# Patient Record
Sex: Male | Born: 1940 | Race: Black or African American | Hispanic: No
Health system: Southern US, Community
[De-identification: ages and names within clinical notes are randomized; demographics above are authoritative.]

---

## 2002-08-15 ENCOUNTER — Encounter: Payer: Self-pay | Admitting: Internal Medicine

## 2002-08-15 ENCOUNTER — Ambulatory Visit (HOSPITAL_COMMUNITY): Admission: RE | Admit: 2002-08-15 | Discharge: 2002-08-15 | Payer: Self-pay | Admitting: Internal Medicine

## 2005-01-31 ENCOUNTER — Inpatient Hospital Stay: Payer: Self-pay | Admitting: Internal Medicine

## 2005-01-31 ENCOUNTER — Other Ambulatory Visit: Payer: Self-pay

## 2005-09-29 ENCOUNTER — Emergency Department: Payer: Self-pay | Admitting: Emergency Medicine

## 2006-07-09 ENCOUNTER — Other Ambulatory Visit: Payer: Self-pay

## 2006-07-09 ENCOUNTER — Inpatient Hospital Stay: Payer: Self-pay | Admitting: Internal Medicine

## 2007-06-17 ENCOUNTER — Encounter: Payer: Self-pay | Admitting: Internal Medicine

## 2007-07-12 ENCOUNTER — Encounter: Payer: Self-pay | Admitting: Internal Medicine

## 2007-08-12 ENCOUNTER — Encounter: Payer: Self-pay | Admitting: Internal Medicine

## 2007-08-29 ENCOUNTER — Inpatient Hospital Stay: Payer: Self-pay | Admitting: Internal Medicine

## 2007-08-29 ENCOUNTER — Other Ambulatory Visit: Payer: Self-pay

## 2007-09-12 ENCOUNTER — Encounter: Payer: Self-pay | Admitting: Internal Medicine

## 2007-10-17 ENCOUNTER — Emergency Department: Payer: Self-pay | Admitting: Emergency Medicine

## 2007-10-17 ENCOUNTER — Other Ambulatory Visit: Payer: Self-pay

## 2007-11-12 ENCOUNTER — Inpatient Hospital Stay: Payer: Self-pay | Admitting: Internal Medicine

## 2007-11-12 ENCOUNTER — Other Ambulatory Visit: Payer: Self-pay

## 2008-07-28 ENCOUNTER — Emergency Department: Payer: Self-pay | Admitting: Emergency Medicine

## 2008-10-01 ENCOUNTER — Emergency Department: Payer: Self-pay | Admitting: Emergency Medicine

## 2009-01-22 ENCOUNTER — Emergency Department: Payer: Self-pay | Admitting: Emergency Medicine

## 2010-05-06 ENCOUNTER — Inpatient Hospital Stay: Payer: Self-pay | Admitting: Internal Medicine

## 2010-06-14 ENCOUNTER — Inpatient Hospital Stay: Payer: Self-pay | Admitting: Internal Medicine

## 2011-09-12 ENCOUNTER — Ambulatory Visit: Payer: Self-pay | Admitting: Internal Medicine

## 2011-09-29 ENCOUNTER — Inpatient Hospital Stay: Payer: Self-pay | Admitting: Internal Medicine

## 2011-09-29 LAB — COMPREHENSIVE METABOLIC PANEL
Albumin: 4.3 g/dL (ref 3.4–5.0)
Alkaline Phosphatase: 106 U/L (ref 50–136)
Anion Gap: 7 (ref 7–16)
BUN: 18 mg/dL (ref 7–18)
Bilirubin,Total: 0.4 mg/dL (ref 0.2–1.0)
Calcium, Total: 9.3 mg/dL (ref 8.5–10.1)
Chloride: 101 mmol/L (ref 98–107)
Co2: 29 mmol/L (ref 21–32)
Creatinine: 1.31 mg/dL — ABNORMAL HIGH (ref 0.60–1.30)
EGFR (African American): >60
EGFR (Non-African Amer.): 58 — ABNORMAL LOW
Glucose: 146 mg/dL — ABNORMAL HIGH (ref 65–99)
Osmolality: 278 (ref 275–301)
Potassium: 3.8 mmol/L (ref 3.5–5.1)
SGOT(AST): 20 U/L (ref 15–37)
SGPT (ALT): 17 U/L
Sodium: 137 mmol/L (ref 136–145)
Total Protein: 8.5 g/dL — ABNORMAL HIGH (ref 6.4–8.2)

## 2011-09-29 LAB — CBC
HCT: 44.8 % (ref 40.0–52.0)
HGB: 14.4 g/dL (ref 13.0–18.0)
MCH: 26.1 pg (ref 26.0–34.0)
MCHC: 32.1 g/dL (ref 32.0–36.0)
MCV: 81 fL (ref 80–100)
Platelet: 191 10*3/uL (ref 150–440)
RBC: 5.51 10*6/uL (ref 4.40–5.90)
RDW: 16.9 % — ABNORMAL HIGH (ref 11.5–14.5)
WBC: 10.2 10*3/uL (ref 3.8–10.6)

## 2011-09-29 LAB — CK TOTAL AND CKMB (NOT AT ARMC)
CK, Total: 326 U/L — ABNORMAL HIGH (ref 35–232)
CK-MB: 1.7 ng/mL (ref 0.5–3.6)

## 2011-09-29 LAB — PROTIME-INR
INR: 1
Prothrombin Time: 13.6 secs (ref 11.5–14.7)

## 2011-09-29 LAB — TROPONIN I: Troponin-I: <0.02 ng/mL

## 2011-09-29 LAB — PRO B NATRIURETIC PEPTIDE: B-Type Natriuretic Peptide: 40 pg/mL (ref 0–125)

## 2011-09-29 LAB — APTT: Activated PTT: 28.6 secs (ref 23.6–35.9)

## 2011-09-30 LAB — CBC WITH DIFFERENTIAL/PLATELET
Basophil #: 0 10*3/uL (ref 0.0–0.1)
Basophil %: 0.3 %
Eosinophil #: 0 10*3/uL (ref 0.0–0.7)
Eosinophil %: 0 %
HCT: 40.8 % (ref 40.0–52.0)
HGB: 13.3 g/dL (ref 13.0–18.0)
Lymphocyte #: 0.8 10*3/uL — ABNORMAL LOW (ref 1.0–3.6)
Lymphocyte %: 4.7 %
MCH: 26.3 pg (ref 26.0–34.0)
MCHC: 32.6 g/dL (ref 32.0–36.0)
MCV: 81 fL (ref 80–100)
Monocyte #: 1.1 10*3/uL — ABNORMAL HIGH (ref 0.0–0.7)
Monocyte %: 6.2 %
Neutrophil #: 15.3 10*3/uL — ABNORMAL HIGH (ref 1.4–6.5)
Neutrophil %: 88.8 %
Platelet: 184 10*3/uL (ref 150–440)
RBC: 5.06 10*6/uL (ref 4.40–5.90)
RDW: 16.8 % — ABNORMAL HIGH (ref 11.5–14.5)
WBC: 17.3 10*3/uL — ABNORMAL HIGH (ref 3.8–10.6)

## 2011-09-30 LAB — BASIC METABOLIC PANEL
Anion Gap: 10 (ref 7–16)
BUN: 19 mg/dL — ABNORMAL HIGH (ref 7–18)
Calcium, Total: 9 mg/dL (ref 8.5–10.1)
Chloride: 103 mmol/L (ref 98–107)
Co2: 27 mmol/L (ref 21–32)
Creatinine: 1.12 mg/dL (ref 0.60–1.30)
EGFR (African American): >60
EGFR (Non-African Amer.): >60
Glucose: 125 mg/dL — ABNORMAL HIGH (ref 65–99)
Osmolality: 283 (ref 275–301)
Potassium: 4.4 mmol/L (ref 3.5–5.1)
Sodium: 140 mmol/L (ref 136–145)

## 2011-10-01 LAB — CBC WITH DIFFERENTIAL/PLATELET
Basophil #: 0 10*3/uL (ref 0.0–0.1)
Basophil %: 0.2 %
Eosinophil #: 0 10*3/uL (ref 0.0–0.7)
Eosinophil %: 0 %
HCT: 42.8 % (ref 40.0–52.0)
HGB: 14 g/dL (ref 13.0–18.0)
Lymphocyte #: 0.8 10*3/uL — ABNORMAL LOW (ref 1.0–3.6)
MCH: 26.4 pg (ref 26.0–34.0)
MCHC: 32.7 g/dL (ref 32.0–36.0)
MCV: 81 fL (ref 80–100)
Monocyte #: 1.1 10*3/uL — ABNORMAL HIGH (ref 0.0–0.7)
Neutrophil #: 20.3 10*3/uL — ABNORMAL HIGH (ref 1.4–6.5)
Neutrophil %: 91.1 %
RDW: 16.9 % — ABNORMAL HIGH (ref 11.5–14.5)

## 2011-10-03 ENCOUNTER — Inpatient Hospital Stay: Payer: Self-pay | Admitting: Internal Medicine

## 2011-10-03 LAB — COMPREHENSIVE METABOLIC PANEL
Anion Gap: 8 (ref 7–16)
BUN: 28 mg/dL — ABNORMAL HIGH (ref 7–18)
Bilirubin,Total: 0.5 mg/dL (ref 0.2–1.0)
Calcium, Total: 8.8 mg/dL (ref 8.5–10.1)
Chloride: 101 mmol/L (ref 98–107)
Co2: 33 mmol/L — ABNORMAL HIGH (ref 21–32)
EGFR (African American): >60
Glucose: 137 mg/dL — ABNORMAL HIGH (ref 65–99)
SGOT(AST): 46 U/L — ABNORMAL HIGH (ref 15–37)
SGPT (ALT): 34 U/L
Sodium: 142 mmol/L (ref 136–145)
Total Protein: 7.1 g/dL (ref 6.4–8.2)

## 2011-10-03 LAB — URINALYSIS, COMPLETE
Bilirubin,UR: NEGATIVE
Blood: NEGATIVE
Glucose,UR: NEGATIVE mg/dL (ref 0–75)
Ketone: NEGATIVE
Protein: NEGATIVE
RBC,UR: NONE SEEN /HPF (ref 0–5)
Specific Gravity: 1.018 (ref 1.003–1.030)
Squamous Epithelial: <1
WBC UR: 15 /HPF (ref 0–5)

## 2011-10-03 LAB — CBC WITH DIFFERENTIAL/PLATELET
Basophil #: 0 10*3/uL (ref 0.0–0.1)
Basophil %: 0.4 %
Eosinophil #: 0.1 10*3/uL (ref 0.0–0.7)
Eosinophil %: 1.2 %
HCT: 41.9 % (ref 40.0–52.0)
HGB: 13.9 g/dL (ref 13.0–18.0)
Lymphocyte #: 1.9 10*3/uL (ref 1.0–3.6)
Lymphocyte %: 24.1 %
MCH: 26.6 pg (ref 26.0–34.0)
MCHC: 33.3 g/dL (ref 32.0–36.0)
MCV: 80 fL (ref 80–100)
Monocyte #: 1.1 10*3/uL — ABNORMAL HIGH (ref 0.0–0.7)
Monocyte %: 13.8 %
Neutrophil #: 4.8 10*3/uL (ref 1.4–6.5)
RDW: 17.1 % — ABNORMAL HIGH (ref 11.5–14.5)
WBC: 8 10*3/uL (ref 3.8–10.6)

## 2011-10-03 LAB — AMMONIA: Ammonia, Plasma: <25 mcmol/L (ref 11–32)

## 2011-10-03 LAB — TSH: Thyroid Stimulating Horm: 0.098 u[IU]/mL — ABNORMAL LOW

## 2011-10-04 LAB — BASIC METABOLIC PANEL
BUN: 24 mg/dL — ABNORMAL HIGH (ref 7–18)
Co2: 34 mmol/L — ABNORMAL HIGH (ref 21–32)
Creatinine: 1.16 mg/dL (ref 0.60–1.30)
EGFR (African American): >60
EGFR (Non-African Amer.): >60
Osmolality: 290 (ref 275–301)
Potassium: 4.4 mmol/L (ref 3.5–5.1)

## 2011-10-04 LAB — CK TOTAL AND CKMB (NOT AT ARMC)
CK, Total: 309 U/L — ABNORMAL HIGH (ref 35–232)
CK-MB: 2 ng/mL (ref 0.5–3.6)
CK-MB: 1.1 ng/mL (ref 0.5–3.6)

## 2011-10-04 LAB — CBC WITH DIFFERENTIAL/PLATELET
Eosinophil #: 0 10*3/uL (ref 0.0–0.7)
HCT: 42.5 % (ref 40.0–52.0)
HGB: 13.9 g/dL (ref 13.0–18.0)
Lymphocyte %: 6.5 %
MCH: 26.4 pg (ref 26.0–34.0)
Monocyte #: 0.8 10*3/uL — ABNORMAL HIGH (ref 0.0–0.7)
Neutrophil #: 10.8 10*3/uL — ABNORMAL HIGH (ref 1.4–6.5)
Neutrophil %: 86.5 %
RBC: 5.27 10*6/uL (ref 4.40–5.90)

## 2011-10-04 LAB — THEOPHYLLINE LEVEL: Theophylline: <2 ug/mL — ABNORMAL LOW (ref 10.0–20.0)

## 2011-10-04 LAB — HEMOGLOBIN A1C: Hemoglobin A1C: 6.9 % — ABNORMAL HIGH (ref 4.2–6.3)

## 2011-10-05 LAB — CBC WITH DIFFERENTIAL/PLATELET
Basophil %: 0 %
Eosinophil #: 0 10*3/uL (ref 0.0–0.7)
Eosinophil %: 0 %
HCT: 43.1 % (ref 40.0–52.0)
HGB: 14.1 g/dL (ref 13.0–18.0)
Lymphocyte #: 0.9 10*3/uL — ABNORMAL LOW (ref 1.0–3.6)
MCH: 26.5 pg (ref 26.0–34.0)
MCHC: 32.8 g/dL (ref 32.0–36.0)
MCV: 81 fL (ref 80–100)
Monocyte #: 1.1 10*3/uL — ABNORMAL HIGH (ref 0.0–0.7)
Neutrophil #: 17 10*3/uL — ABNORMAL HIGH (ref 1.4–6.5)
Platelet: 173 10*3/uL (ref 150–440)
RBC: 5.33 10*6/uL (ref 4.40–5.90)
RDW: 16.4 % — ABNORMAL HIGH (ref 11.5–14.5)
WBC: 19 10*3/uL — ABNORMAL HIGH (ref 3.8–10.6)

## 2011-10-05 LAB — BASIC METABOLIC PANEL
Anion Gap: 11 (ref 7–16)
BUN: 22 mg/dL — ABNORMAL HIGH (ref 7–18)
Chloride: 102 mmol/L (ref 98–107)
Co2: 29 mmol/L (ref 21–32)
Creatinine: 1.01 mg/dL (ref 0.60–1.30)
EGFR (African American): >60
Osmolality: 288 (ref 275–301)
Sodium: 142 mmol/L (ref 136–145)

## 2011-10-05 LAB — URINE CULTURE

## 2011-10-06 LAB — BASIC METABOLIC PANEL
Anion Gap: 10 (ref 7–16)
BUN: 18 mg/dL (ref 7–18)
Chloride: 104 mmol/L (ref 98–107)
Creatinine: 0.99 mg/dL (ref 0.60–1.30)
EGFR (African American): >60
EGFR (Non-African Amer.): >60
Glucose: 136 mg/dL — ABNORMAL HIGH (ref 65–99)
Sodium: 142 mmol/L (ref 136–145)

## 2011-10-06 LAB — CBC WITH DIFFERENTIAL/PLATELET
Basophil %: 0 %
Eosinophil %: 0 %
HCT: 44.7 % (ref 40.0–52.0)
Lymphocyte %: 4 %
MCV: 81 fL (ref 80–100)
Monocyte %: 5.3 %
Neutrophil %: 90.7 %
Platelet: 184 10*3/uL (ref 150–440)
RBC: 5.54 10*6/uL (ref 4.40–5.90)
WBC: 19.1 10*3/uL — ABNORMAL HIGH (ref 3.8–10.6)

## 2011-10-06 LAB — LIPID PANEL
Cholesterol: 179 mg/dL (ref 0–200)
Ldl Cholesterol, Calc: 110 mg/dL — ABNORMAL HIGH (ref 0–100)
Triglycerides: 77 mg/dL (ref 0–200)
VLDL Cholesterol, Calc: 15 mg/dL (ref 5–40)

## 2011-10-09 LAB — WBC: WBC: 28.5 10*3/uL — ABNORMAL HIGH (ref 3.8–10.6)

## 2011-10-10 ENCOUNTER — Ambulatory Visit: Payer: Self-pay | Admitting: Internal Medicine

## 2011-10-11 LAB — WBC: WBC: 24.9 10*3/uL — ABNORMAL HIGH (ref 3.8–10.6)

## 2011-10-12 LAB — CBC WITH DIFFERENTIAL/PLATELET
Basophil #: 0 10*3/uL (ref 0.0–0.1)
Basophil %: 0.1 %
Eosinophil #: 0.3 10*3/uL (ref 0.0–0.7)
Eosinophil %: 1.5 %
HCT: 43.7 % (ref 40.0–52.0)
HGB: 14.1 g/dL (ref 13.0–18.0)
Lymphocyte #: 2.6 10*3/uL (ref 1.0–3.6)
Lymphocyte %: 11.5 %
MCH: 26.4 pg (ref 26.0–34.0)
MCV: 82 fL (ref 80–100)
Monocyte #: 1.7 10*3/uL — ABNORMAL HIGH (ref 0.0–0.7)
Monocyte %: 7.4 %
Neutrophil #: 18.2 10*3/uL — ABNORMAL HIGH (ref 1.4–6.5)
RDW: 17.1 % — ABNORMAL HIGH (ref 11.5–14.5)
WBC: 22.9 10*3/uL — ABNORMAL HIGH (ref 3.8–10.6)

## 2011-10-12 LAB — BASIC METABOLIC PANEL
Anion Gap: 4 — ABNORMAL LOW (ref 7–16)
Chloride: 104 mmol/L (ref 98–107)
Co2: 32 mmol/L (ref 21–32)
EGFR (African American): >60
EGFR (Non-African Amer.): >60
Potassium: 3.8 mmol/L (ref 3.5–5.1)
Sodium: 140 mmol/L (ref 136–145)

## 2011-11-18 ENCOUNTER — Emergency Department: Payer: Self-pay | Admitting: Emergency Medicine

## 2012-06-17 ENCOUNTER — Ambulatory Visit: Payer: Self-pay | Admitting: Family Medicine

## 2012-07-13 ENCOUNTER — Other Ambulatory Visit: Payer: Self-pay | Admitting: Podiatry

## 2012-07-17 LAB — WOUND CULTURE

## 2013-05-11 ENCOUNTER — Emergency Department: Payer: Self-pay | Admitting: Internal Medicine

## 2013-05-11 LAB — CBC
HCT: 43.3 % (ref 40.0–52.0)
MCV: 80 fL (ref 80–100)
Platelet: 184 10*3/uL (ref 150–440)
RDW: 15.3 % — ABNORMAL HIGH (ref 11.5–14.5)
WBC: 10.1 10*3/uL (ref 3.8–10.6)

## 2013-05-11 LAB — URINALYSIS, COMPLETE
Bacteria: NONE SEEN
Ketone: NEGATIVE
Leukocyte Esterase: NEGATIVE
Nitrite: NEGATIVE
Ph: 7 (ref 4.5–8.0)
Specific Gravity: 1.003 (ref 1.003–1.030)
Squamous Epithelial: <1

## 2013-05-11 LAB — COMPREHENSIVE METABOLIC PANEL
Albumin: 3.8 g/dL (ref 3.4–5.0)
Alkaline Phosphatase: 131 U/L (ref 50–136)
Bilirubin,Total: 0.4 mg/dL (ref 0.2–1.0)
Calcium, Total: 9.4 mg/dL (ref 8.5–10.1)
Chloride: 104 mmol/L (ref 98–107)
Creatinine: 0.93 mg/dL (ref 0.60–1.30)
EGFR (Non-African Amer.): >60
SGPT (ALT): 20 U/L (ref 12–78)
Sodium: 138 mmol/L (ref 136–145)
Total Protein: 7.8 g/dL (ref 6.4–8.2)

## 2013-05-11 LAB — PROTIME-INR: INR: 1

## 2013-07-15 IMAGING — CR DG CHEST 1V PORT
1 series · 1 of 1 positions shown · non-contrast
Comparison: none

REASON FOR EXAM: dyspnea
COMMENTS:

[portable]
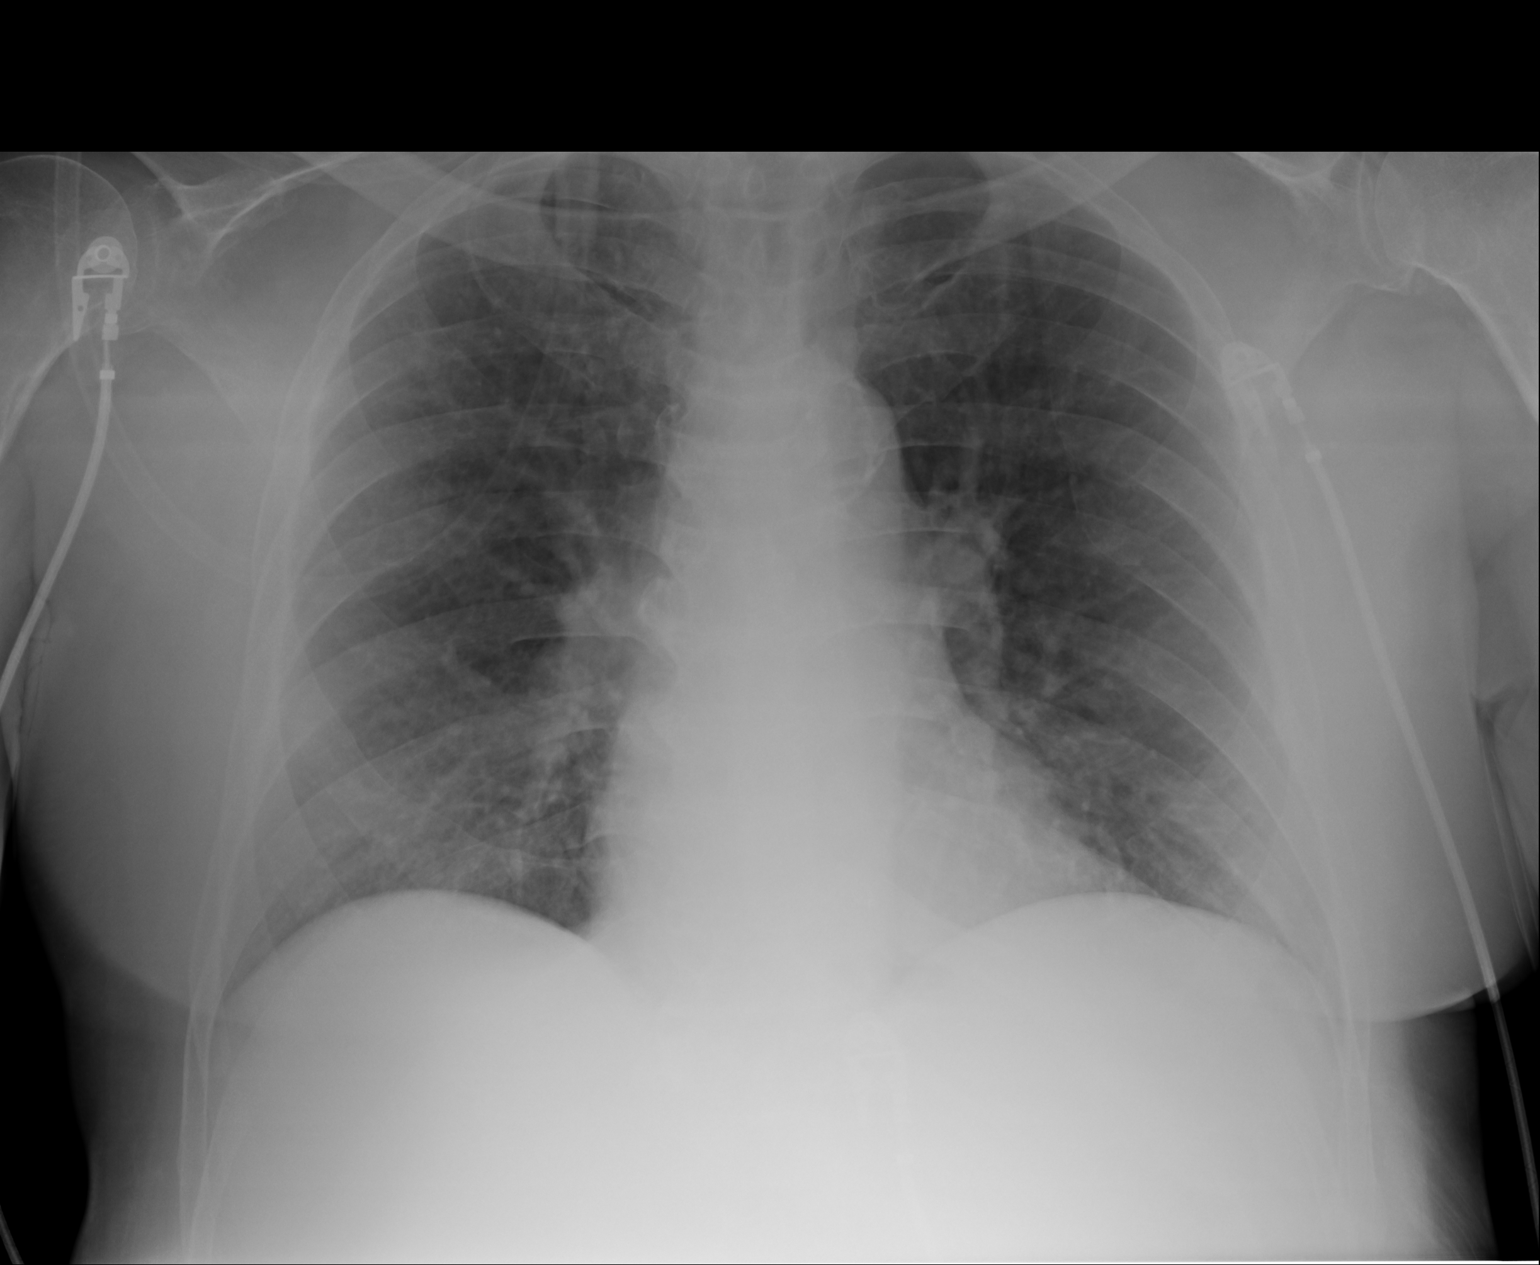

[1 of 1 positions shown; findings below may reference images not displayed]

PROCEDURE:     DXR - DXR PORTABLE CHEST SINGLE VIEW  - September 29, 2011  [DATE]

RESULT:

Comparison is made to a prior exam of 06/18/2010.

No pneumonia, pneumothorax or pleural effusion is seen. There is mild
chronic prominence of the lung markings but no new infiltrative changes are
seen. No pulmonary edema or pleural effusion is noted. The heart size is
normal.
IMPRESSION: No acute changes are identified.

## 2013-07-29 ENCOUNTER — Emergency Department: Payer: Self-pay | Admitting: Emergency Medicine

## 2013-07-29 LAB — CBC WITH DIFFERENTIAL/PLATELET
Basophil #: 0.1 10*3/uL (ref 0.0–0.1)
Basophil %: 1.1 %
Eosinophil #: 0.9 10*3/uL — ABNORMAL HIGH (ref 0.0–0.7)
HCT: 36.8 % — ABNORMAL LOW (ref 40.0–52.0)
HGB: 11.8 g/dL — ABNORMAL LOW (ref 13.0–18.0)
Lymphocyte #: 1.5 10*3/uL (ref 1.0–3.6)
Lymphocyte %: 19.8 %
MCV: 77 fL — ABNORMAL LOW (ref 80–100)
Monocyte #: 0.7 x10 3/mm (ref 0.2–1.0)
Monocyte %: 9.1 %
Neutrophil #: 4.3 10*3/uL (ref 1.4–6.5)
RBC: 4.76 10*6/uL (ref 4.40–5.90)
WBC: 7.5 10*3/uL (ref 3.8–10.6)

## 2013-07-29 LAB — BASIC METABOLIC PANEL
Chloride: 102 mmol/L (ref 98–107)
Creatinine: 0.78 mg/dL (ref 0.60–1.30)
EGFR (African American): >60
EGFR (Non-African Amer.): >60
Osmolality: 283 (ref 275–301)
Potassium: 3.8 mmol/L (ref 3.5–5.1)

## 2013-07-29 LAB — HEPATIC FUNCTION PANEL A (ARMC)
Alkaline Phosphatase: 156 U/L — ABNORMAL HIGH
SGPT (ALT): 58 U/L (ref 12–78)
Total Protein: 7.7 g/dL (ref 6.4–8.2)

## 2013-07-29 LAB — URINALYSIS, COMPLETE
Bilirubin,UR: NEGATIVE
Glucose,UR: NEGATIVE mg/dL (ref 0–75)
Nitrite: NEGATIVE
Protein: 30
RBC,UR: 13 /HPF (ref 0–5)
WBC UR: 29 /HPF (ref 0–5)

## 2013-07-29 LAB — MAGNESIUM: Magnesium: 1.6 mg/dL — ABNORMAL LOW

## 2013-08-20 ENCOUNTER — Emergency Department: Payer: Self-pay | Admitting: Emergency Medicine

## 2013-09-11 ENCOUNTER — Inpatient Hospital Stay: Payer: Self-pay | Admitting: Internal Medicine

## 2013-09-11 DIAGNOSIS — I059 Rheumatic mitral valve disease, unspecified: Secondary | ICD-10-CM

## 2013-09-11 LAB — COMPREHENSIVE METABOLIC PANEL
ALBUMIN: 3.1 g/dL — AB (ref 3.4–5.0)
ALT: 39 U/L (ref 12–78)
AST: 43 U/L — AB (ref 15–37)
Alkaline Phosphatase: 173 U/L — ABNORMAL HIGH
Anion Gap: 4 — ABNORMAL LOW (ref 7–16)
BUN: 14 mg/dL (ref 7–18)
Bilirubin,Total: 0.9 mg/dL (ref 0.2–1.0)
CHLORIDE: 100 mmol/L (ref 98–107)
Calcium, Total: 9.6 mg/dL (ref 8.5–10.1)
Co2: 31 mmol/L (ref 21–32)
Creatinine: 0.77 mg/dL (ref 0.60–1.30)
EGFR (African American): >60
EGFR (Non-African Amer.): >60
GLUCOSE: 82 mg/dL (ref 65–99)
OSMOLALITY: 270 (ref 275–301)
POTASSIUM: 4 mmol/L (ref 3.5–5.1)
SODIUM: 135 mmol/L — AB (ref 136–145)
TOTAL PROTEIN: 8.1 g/dL (ref 6.4–8.2)

## 2013-09-11 LAB — URINALYSIS, COMPLETE
Bacteria: NONE SEEN
Bilirubin,UR: NEGATIVE
Blood: NEGATIVE
Glucose,UR: NEGATIVE mg/dL (ref 0–75)
Hyaline Cast: 6
Ketone: NEGATIVE
Leukocyte Esterase: NEGATIVE
NITRITE: NEGATIVE
PROTEIN: NEGATIVE
Ph: 6 (ref 4.5–8.0)
RBC,UR: 1 /HPF (ref 0–5)
Specific Gravity: 1.015 (ref 1.003–1.030)
Squamous Epithelial: 1

## 2013-09-11 LAB — CBC
HCT: 37.9 % — ABNORMAL LOW (ref 40.0–52.0)
HGB: 12.5 g/dL — AB (ref 13.0–18.0)
MCH: 25.4 pg — ABNORMAL LOW (ref 26.0–34.0)
MCHC: 32.9 g/dL (ref 32.0–36.0)
MCV: 77 fL — ABNORMAL LOW (ref 80–100)
PLATELETS: 152 10*3/uL (ref 150–440)
RBC: 4.91 10*6/uL (ref 4.40–5.90)
RDW: 17.9 % — ABNORMAL HIGH (ref 11.5–14.5)
WBC: 5.7 10*3/uL (ref 3.8–10.6)

## 2013-09-11 LAB — CK TOTAL AND CKMB (NOT AT ARMC)
CK, TOTAL: 242 U/L — AB (ref 35–232)
CK, TOTAL: 251 U/L — AB (ref 35–232)
CK, Total: 195 U/L (ref 35–232)
CK-MB: 43.2 ng/mL — ABNORMAL HIGH (ref 0.5–3.6)
CK-MB: 39.1 ng/mL — AB (ref 0.5–3.6)
CK-MB: 34.9 ng/mL — ABNORMAL HIGH (ref 0.5–3.6)

## 2013-09-11 LAB — TSH: THYROID STIMULATING HORM: 2.8 u[IU]/mL

## 2013-09-11 LAB — TROPONIN I
Troponin-I: <0.02 ng/mL
Troponin-I: <0.02 ng/mL

## 2013-09-12 ENCOUNTER — Ambulatory Visit: Payer: Self-pay | Admitting: Hospice and Palliative Medicine

## 2013-09-12 LAB — CBC WITH DIFFERENTIAL/PLATELET
Basophil #: 0 10*3/uL (ref 0.0–0.1)
Basophil %: 0.1 %
EOS PCT: 0 %
Eosinophil #: 0 10*3/uL (ref 0.0–0.7)
HCT: 34.4 % — AB (ref 40.0–52.0)
HGB: 11.2 g/dL — AB (ref 13.0–18.0)
Lymphocyte #: 0.5 10*3/uL — ABNORMAL LOW (ref 1.0–3.6)
Lymphocyte %: 12.4 %
MCH: 25.2 pg — ABNORMAL LOW (ref 26.0–34.0)
MCHC: 32.4 g/dL (ref 32.0–36.0)
MCV: 78 fL — AB (ref 80–100)
MONOS PCT: 2.4 %
Monocyte #: 0.1 x10 3/mm — ABNORMAL LOW (ref 0.2–1.0)
NEUTROS ABS: 3.5 10*3/uL (ref 1.4–6.5)
Neutrophil %: 85.1 %
PLATELETS: 141 10*3/uL — AB (ref 150–440)
RBC: 4.43 10*6/uL (ref 4.40–5.90)
RDW: 18 % — AB (ref 11.5–14.5)
WBC: 4.1 10*3/uL (ref 3.8–10.6)

## 2013-09-12 LAB — BASIC METABOLIC PANEL
Anion Gap: 1 — ABNORMAL LOW (ref 7–16)
BUN: 11 mg/dL (ref 7–18)
CALCIUM: 9 mg/dL (ref 8.5–10.1)
CREATININE: 0.99 mg/dL (ref 0.60–1.30)
Chloride: 108 mmol/L — ABNORMAL HIGH (ref 98–107)
Co2: 30 mmol/L (ref 21–32)
EGFR (African American): >60
EGFR (Non-African Amer.): >60
Glucose: 138 mg/dL — ABNORMAL HIGH (ref 65–99)
OSMOLALITY: 279 (ref 275–301)
Potassium: 4 mmol/L (ref 3.5–5.1)
Sodium: 139 mmol/L (ref 136–145)

## 2013-09-12 LAB — URINE CULTURE

## 2013-09-13 LAB — VANCOMYCIN, TROUGH: VANCOMYCIN, TROUGH: 18 ug/mL (ref 10–20)

## 2013-09-14 LAB — CBC WITH DIFFERENTIAL/PLATELET
BASOS ABS: 0 10*3/uL (ref 0.0–0.1)
BASOS PCT: 0.4 %
EOS PCT: 0 %
Eosinophil #: 0 10*3/uL (ref 0.0–0.7)
HCT: 37 % — AB (ref 40.0–52.0)
HGB: 11.8 g/dL — ABNORMAL LOW (ref 13.0–18.0)
Lymphocyte #: 1.5 10*3/uL (ref 1.0–3.6)
Lymphocyte %: 13.5 %
MCH: 25 pg — AB (ref 26.0–34.0)
MCHC: 32 g/dL (ref 32.0–36.0)
MCV: 78 fL — AB (ref 80–100)
MONO ABS: 1.3 x10 3/mm — AB (ref 0.2–1.0)
Monocyte %: 11.6 %
NEUTROS ABS: 8.4 10*3/uL — AB (ref 1.4–6.5)
NEUTROS PCT: 74.5 %
PLATELETS: 128 10*3/uL — AB (ref 150–440)
RBC: 4.73 10*6/uL (ref 4.40–5.90)
RDW: 18.4 % — ABNORMAL HIGH (ref 11.5–14.5)
WBC: 11.2 10*3/uL — ABNORMAL HIGH (ref 3.8–10.6)

## 2013-09-14 LAB — BASIC METABOLIC PANEL
Anion Gap: 6 — ABNORMAL LOW (ref 7–16)
BUN: 12 mg/dL (ref 7–18)
CO2: 27 mmol/L (ref 21–32)
CREATININE: 0.66 mg/dL (ref 0.60–1.30)
Calcium, Total: 9.2 mg/dL (ref 8.5–10.1)
Chloride: 108 mmol/L — ABNORMAL HIGH (ref 98–107)
Glucose: 123 mg/dL — ABNORMAL HIGH (ref 65–99)
Osmolality: 282 (ref 275–301)
Potassium: 3.4 mmol/L — ABNORMAL LOW (ref 3.5–5.1)
SODIUM: 141 mmol/L (ref 136–145)

## 2013-09-15 LAB — CBC WITH DIFFERENTIAL/PLATELET
BASOS ABS: 0 10*3/uL (ref 0.0–0.1)
Basophil %: 0.4 %
EOS PCT: 2.2 %
Eosinophil #: 0.3 10*3/uL (ref 0.0–0.7)
HCT: 36.4 % — ABNORMAL LOW (ref 40.0–52.0)
HGB: 11.7 g/dL — AB (ref 13.0–18.0)
Lymphocyte #: 3.1 10*3/uL (ref 1.0–3.6)
Lymphocyte %: 25.1 %
MCH: 25.3 pg — ABNORMAL LOW (ref 26.0–34.0)
MCHC: 32.3 g/dL (ref 32.0–36.0)
MCV: 78 fL — ABNORMAL LOW (ref 80–100)
MONOS PCT: 13.8 %
Monocyte #: 1.7 x10 3/mm — ABNORMAL HIGH (ref 0.2–1.0)
NEUTROS ABS: 7.2 10*3/uL — AB (ref 1.4–6.5)
Neutrophil %: 58.5 %
PLATELETS: 146 10*3/uL — AB (ref 150–440)
RBC: 4.65 10*6/uL (ref 4.40–5.90)
RDW: 18.7 % — ABNORMAL HIGH (ref 11.5–14.5)
WBC: 12.3 10*3/uL — ABNORMAL HIGH (ref 3.8–10.6)

## 2013-09-16 LAB — CULTURE, BLOOD (SINGLE)

## 2013-10-09 ENCOUNTER — Ambulatory Visit: Payer: Self-pay | Admitting: Hospice and Palliative Medicine

## 2013-11-30 ENCOUNTER — Encounter: Payer: Self-pay | Admitting: Surgery

## 2013-12-06 ENCOUNTER — Ambulatory Visit: Payer: Self-pay | Admitting: Surgery

## 2013-12-08 ENCOUNTER — Emergency Department: Payer: Self-pay | Admitting: Emergency Medicine

## 2013-12-08 LAB — BASIC METABOLIC PANEL
ANION GAP: 3 — AB (ref 7–16)
BUN: 22 mg/dL — ABNORMAL HIGH (ref 7–18)
CALCIUM: 9.1 mg/dL (ref 8.5–10.1)
CHLORIDE: 105 mmol/L (ref 98–107)
Co2: 31 mmol/L (ref 21–32)
Creatinine: 0.63 mg/dL (ref 0.60–1.30)
EGFR (Non-African Amer.): >60
Glucose: 104 mg/dL — ABNORMAL HIGH (ref 65–99)
Osmolality: 281 (ref 275–301)
POTASSIUM: 3.8 mmol/L (ref 3.5–5.1)
Sodium: 139 mmol/L (ref 136–145)

## 2013-12-08 LAB — CBC
HCT: 40.2 % (ref 40.0–52.0)
HGB: 13 g/dL (ref 13.0–18.0)
MCH: 25 pg — ABNORMAL LOW (ref 26.0–34.0)
MCHC: 32.3 g/dL (ref 32.0–36.0)
MCV: 78 fL — AB (ref 80–100)
PLATELETS: 199 10*3/uL (ref 150–440)
RBC: 5.19 10*6/uL (ref 4.40–5.90)
RDW: 18.3 % — ABNORMAL HIGH (ref 11.5–14.5)
WBC: 9.8 10*3/uL (ref 3.8–10.6)

## 2014-03-11 DEATH — deceased

## 2014-12-02 NOTE — Discharge Summary (Signed)
PATIENT NAME:  Colton Stevenson, Colton Stevenson MR#:  [id number] DATE OF BIRTH:  [birth year]  DATE OF ADMISSION:  09/11/2013 DATE OF DISCHARGE:  09/15/2013   ADMITTING PHYSICIAN: Auburn Bilberry, MD  DISCHARGING PHYSICIAN: Enid Baas, MD   PRIMARY CARE PHYSICIAN: At [facility]   CONSULTATIONS IN THE HOSPITAL: Palliative care consultation by Dr. Harriett Sine Phifer and team.   DISCHARGE DIAGNOSES: 1.  Altered mental status, likely metabolic encephalopathy.  2.  Hypotension.  3.  Hypothermia, sepsis ruled out, likely from poor p.o. intake.  4.  History of cerebrovascular accident with right-sided weakness and worsening generalized weakness. Bedbound and wheelchair-bound at this time.  5.  Dysphagia.  6.  Hypertension.  7.  Seizure disorder.  8.  Chronic obstructive pulmonary disease.  9.  Benign prostatic hypertrophy.  10.  Type 2 diabetes mellitus.  11.  Gastroesophageal reflux disease.   DISCHARGE HOME MEDICATIONS:  1.  Spiriva HandiHaler 1 capsule daily.  2.  Omeprazole 20 mg p.o. daily.  3.  Fish oil 1 gram capsule p.o. daily.  4.  Lumigan eye drops 0.01% ophthalmic solution once a day at night.  5.  ProAir inhaler 2 puffs four times a day as needed for wheezing.  6.  Aspirin 81 mg p.o. daily.  7.  Finasteride 5 mg p.o. daily.  8.  Vitamin D3 at 1000 international units p.o. daily.  9.  Keppra 500 mg p.o. b.i.d.  10.  Symbicort 160/4.5 mcg two puffs inhalation b.i.d.  11.  Atorvastatin 10 mg p.o. daily.  12.  Albuterol nebulizer 3 mL four times a day as needed for wheezing.   DIET: The patient needs to be on a low-sodium puree diet with nectar-thick liquids. Strict aspiration precautions need to be followed.  DISCHARGE INSTRUCTIONS: Resume home health nursing services for wound care and dressing changes for the right leg and back wounds.    FOLLOWUP:  1.  PCP follow-up in one week.  2.  Advised to check blood pressure also in the left arm and in the left leg if appears low. Also check  manually. 3.  Please continue on the dysphagia diet as risk of aspiration is pretty high.  4.  Home oxygen: None.  LABORATORIES AND IMAGING STUDIES PRIOR TO DISCHARGE:  1.  WBC 12.3, hemoglobin 11.7, hematocrit 36.4, platelet count 146.  2.  Sodium 141, potassium 3.4, chloride 108, bicarb 27, BUN 12, creatinine 0.66, glucose 123 and calcium of 9.2.  3.  Troponins are negative in the hospital.  4.  Echo Doppler showing LV ejection fraction of 60% to 65%. No significant wall-motion abnormalities.  5.  Blood cultures are negative.  6.  Chest x-ray showing bibasilar atelectasis. No acute cardiopulmonary disease.  7.  Urinalysis is negative for any infection.   BRIEF HOSPITAL COURSE: Colton Stevenson is a 74 year old African American male with past medical history significant for stroke with right-sided hemiparesis, chronic slurred speech and also dysphagia, hypertension, diabetes, BPH, COPD, who was from [facility] assisted living facility and was brought in secondary to altered mental status. The patient was also noted to be hypothermic and hypotensive on admission.  1.  Altered mental status, likely metabolic encephalopathy caused by poor perfusion to the brain from hypotension and also hypothermia. The patient seems to be improved and is back to baseline at this time. At baseline, He is alert, has slurred speech, has good days and bad days where he just follows commands if he wants to.  2.  Hypotension and hypothermia, initially  thought to be sepsis. His cultures were negative. Chest x-ray and urinalysis were negative for any infection. Initially empirically was placed on antibiotics, broad-spectrum, with vanc and Zosyn; however, once the cultures were negative, has not had any fevers. Antibiotics are being discontinued. Likely source could have been hypovolemia and dehydration as the causes. TSH was checked and was within normal limits. He has not required any pressors as hypotension improved with IV  fluids and blood pressure is actually slightly on the higher side. However, his blood pressure medications, lisinopril and Lasix, are being held at the time of discharge.  3.  History of cerebrovascular accident with right-sided weakness and dysphagia. The patient failed physical therapy ordered at [facility] a few weeks ago. His weakness has been worsening and he is pretty much  bedbound and wheelchair-bound for the past 5 months at least. He already has home health nursing services and PT services can be resumed if needed over there. He is almost total care at this time.  4.  For dysphagia, he was seen by speech pathologist while in the hospital who recommended nectar-thick liquids and pureed diet. Apparently the patient was on a regular diet, and with his stroke and also his facial droop and pocketing of food in the right side of the face, he is a high-risk candidate for aspiration and is recommended to be on a nectar-thick liquid with a pureed diet at this time.  5.  Chronic obstructive pulmonary disease, stable. Continue on Symbicort, Spiriva and also nebulizer with albuterol.  His course has been otherwise uneventful in the hospital.   DISCHARGE CONDITION: Stable.   [facility]GE DISPOSITION: [facility].   TIME SPENT ON DISCHARGE: 45 minutes.   CODE STATUS: FULL CODE. THE PATIENT IS A WARD OF THE STATE AND PALLIATIVE CARE HAS INITIATED CODE STATUS DISCUSSION WITH THEM.   ____________________________ Enid Baasadhika Zackrey Dyar, MD rk:np D: 09/15/2013 14:23:11 ET T: 09/15/2013 15:02:57 ET JOB#: [id number]  cc: Enid Baasadhika Tiyana Galla, MD, <Dictator> Enid BaasADHIKA Jayde Mcallister MD ELECTRONICALLY SIGNED 09/27/2013 14:56

## 2014-12-02 NOTE — H&P (Signed)
PATIENT NAME:  Colton Stevenson, Jobany L MR#:  [id number] DATE OF BIRTH:  [birth year]  DATE OF ADMISSION:  09/11/2013  PRIMARY CARE PROVIDER: [facility].   EMERGENCY DEPARTMENT REFERRING PHYSICIAN: Dr. Mindi JunkerGottlieb, MD.   CHIEF COMPLAINT: Sent from the group home for weakness, was found sitting on the floor with his knee down which is very unusual for the patient.   HISTORY OF PRESENT ILLNESS: The patient is a 74 year old male who has history of previous chronic obstructive pulmonary disease, history of cerebrovascular accident with right-sided weakness. Hypertension, diabetes, seizure disorder, GERD, history of BPH, was hospitalized in 2013. At that time, he was admitted with acute respiratory failure, acute encephalopathy and was diagnosed with pneumonia, as well as urinary tract infection. The patient was in the hospital for a prolonged period of time. He has not been hospitalized since. At that time, he was also seen by palliative care team. His health care power of attorney is awarded to the state. Apparently, he does have a daughter. At that time, he was still kept FULL CODE. He is sent from the group home because he has been very weak. He has not been eating or drinking much and they sent him in for failure to thrive. When the patient got to the ED his temperature was 91.9 and he also started becoming hypotensive with a blood pressure of 72/49. Currently, the patient is nonverbal and is unable to give any history. History is obtained from his  previous chart.   PAST MEDICAL HISTORY:  1. History of chronic respiratory failure.  2. Diabetes type 2.  3. COPD.  4. Hypertension.  5. Chronic dysphagia.  6. History of BPH.  7. History of muscle weakness.  8. History of previous.  9. CVA on the  left. 10. Seizure disorder.  11. GERD.   ALLERGIES: None.   MEDICATIONS: Listed from the facility: Children's aspirin 81 mg 1 tab p.o. daily, finasteride 5 mg daily, fish oil 1000 mg daily. Lasix 20 daily,  lisinopril 2.5 daily, omeprazole 20 daily, Spiriva 18 mcg daily, vitamin D 1000 international units daily, Advair 250/50, 1 puff b.i.d., Keppra 500 mg 1 tab p.o. b.i.d., Lumigan 0.01% one drop both eyes at bedtime, albuterol nebulizers 4 times daily p.r.n., Proair 2 puffs 4 times daily  p.r.n.   SOCIAL HISTORY: The patient has a history of smoking in the past but unknown now. No history of alcohol or drug use.   FAMILY HISTORY: The patient unable to provide any history.   REVIEW OF SYSTEMS: Unobtainable due to his current mental status.   PHYSICAL EXAMINATION: VITAL SIGNS: Temperature 91.9, pulse 51, respirations 12, blood pressure was 79/49, O2 95%.  GENERAL: The patient is a chronically debilitated, ill looking male.  HEENT: Head atraumatic, normocephalic. Pupils equally round, reactive to light and accommodation. There is no conjunctival pallor. No scleral icterus. Nasal exam shows no drainage or ulceration.  OROPHARYNX: He is not able to follow commands to open his mouth.  NECK: No thyromegaly. No carotid bruits.  EXTERNAL EAR: Exam shows no drainage or erythema.  NOSE: Nasal exam shows no drainage or ulceration.  CARDIOVASCULAR: Regular rate and rhythm. No murmurs, rubs, clicks or gallops. PMI is not displaced.  LUNGS: Clear to auscultation bilaterally without any rales, rhonchi, wheezing.  ABDOMEN: Soft, nontender, nondistended. Positive bowel sounds x 4. No hepatosplenomegaly.  EXTREMITIES: No clubbing, cyanosis, or edema.  SKIN: No rash.  LYMPHATICS: No lymph nodes palpable.  VASCULAR: Good DP, PT pulses.  PSYCHIATRIC: Not anxious  or depressed. Awake.  NEUROLOGICAL: Limited exam. The patient not following commands. Cranial nerves II through XII grossly appear intact. He is not following commands to move any of the extremities at this point.   LYMPH NODES: None palpable.   LABORATORIES: Glucose 82, BUN 14, creatinine 0.77, sodium 135, potassium 4.0, chloride 100, CO2 31, calcium  9.6. LFTs: Total protein 8.1, albumin 3.1, bilirubin total 0.9, alkaline phosphatase is 173. AST is 43, ALT is 39. CPK 242, CK-MB 43.2. Troponin less than 0.02. TSH is 2.80. WBC count 5.7, hemoglobin 12.5, platelet count 152.   Urinalysis: Nitrites negative, leukocytes negative.   Chest x-ray shows suboptimal inspiration, mild basilar atelectasis. No acute cardiopulmonary disease identified.  CT of the head without contrast shows large old left MCA territory infarct.   EKG shows marked sinus bradycardia with a heart rate of 49. No ST-T wave changes.   ASSESSMENT AND PLAN: The patient is a 74 year old male with previous history of large cerebrovascular accident who is sent for progressive weakness, failure to thrive. Noted to be hypotensive, hypothermic here in the ED. No clear source of these identified.  1. Hypotension and hypothermia possible source sepsis, although his urinalysis and chest x-ray are negative. At this time, we are going to give him aggressive intravenous fluids. Continue the warming measures with a warming blanket. I am going to place him on empiric steroids. Check a STAT random cortisol. His TSH is normal so that goes against hypothermic myxedema.  2. History of cerebrovascular accident: We will continue aspirin as taking.  3. History of chronic obstructive pulmonary disease. Continue nebulizers p.r.n. Spiriva and Advair as taking at the facility.  4. Diabetes. He is not on any treatment. We will  place him on sliding scale.  5. Elevated CK-MB CPK. His CK-MB index is much elevated compared to  . This could be related to cardiac cause. His EKGs does not show any abnormality. I will go ahead and check an aspirin, follow enzymes. The patient may need further cardiac evaluation.  6. CODE STATUS: FULL per previous records.  Has been seen by palliative care. I am going to ask them to come see the patient in light of his worsening condition and failure to thrive.   In light of the  patient's hypothermia and hypotension he is critically ill and is at risk of cardiopulmonary arrest.   TIME SPENT ON THIS PATIENT CRITICAL CARE TIME: 50 minutes.     ____________________________ Lacie Scotts Allena Katz, MD shp:sg D: 09/11/2013 12:02:00 ET T: 09/11/2013 13:37:50 ET JOB#: [id number]  cc: Marquize Seib H. Allena Katz, MD, <Dictator> Charise Carwin MD ELECTRONICALLY SIGNED 09/25/2013 13:09

## 2014-12-02 NOTE — Consult Note (Signed)
   Comments   I spoke with Ms. Nolen MuMcKinney with DSS. She has talked with pt's sister who wanted "everything done" in regards to resuscitation. DSS plans to speak with other family members and will make an ultimate decision regarding pt's code status.   Electronic Signatures: Derral Colucci, Daryl EasternJoshua R (NP)  (Signed 04-Feb-15 13:44)  Authored: Palliative Care   Last Updated: 04-Feb-15 13:44 by Malachy MoanBorders, Quantrell Splitt R (NP)

## 2014-12-03 NOTE — H&P (Signed)
PATIENT NAME:  Colton Stevenson, Colton Stevenson MR#:  [id number] DATE OF BIRTH:  1941-06-14  DATE OF ADMISSION:  09/29/2011  ADDENDUM:   Nicotine addiction: The patient was counseled for four minutes regarding cessation of smoking and offered treatments to assist with stopping smoking. The patient will be offered nicotine patch. He will decide whether he wants to continue this.    ____________________________ Lacie ScottsShreyang H. Allena KatzPatel, MD shp:cbb D: 09/29/2011 10:10:49 ET T: 09/29/2011 10:27:12 ET JOB#: [id number]  cc: Wasyl Dornfeld H. Allena KatzPatel, MD, <Dictator> Charise CarwinSHREYANG H Shwanda Soltis MD ELECTRONICALLY SIGNED 10/17/2011 7:49

## 2014-12-03 NOTE — H&P (Signed)
PATIENT NAME:  Colton Stevenson, Colton Stevenson MR#:  [id number] DATE OF BIRTH:  1940/11/03  DATE OF ADMISSION:  09/29/2011  PRIMARY CARE PHYSICIAN: Evelene CroonMeindert Niemeyer, MD  EMERGENCY DEPARTMENT PHYSICIAN: Dana AllanMarwan Powers, MD  CHIEF COMPLAINT: Acute respiratory failure.   HISTORY OF PRESENT ILLNESS: The patient is a 74 year old African American male with known history of chronic obstructive pulmonary disease, hypertension, diabetes, and previous history of cerebrovascular accident who has been having shortness of breath for the past one week, according to him, with productive cough. He started becoming more short of breath, therefore, EMS was called. The patient had to be placed on 100% nonrebreather. He has received treatment with nebulizers, magnesium sulfate, and Solu-Medrol. He is still having a lot of wheezing and is requiring high flow O2. The patient reports that he also has had productive cough of yellow sputum. He denies any fevers or chills. He does complain of chest pain with coughing. He also reports significant amount of wheezing. The patient reports that otherwise he has not noticed any lower extremity swelling. He denies any abdominal pain, nausea, vomiting, diarrhea, or any urinary symptoms.   PAST MEDICAL HISTORY:  1. History of chronic obstructive pulmonary disease with ongoing tobacco abuse.  2. History of cerebrovascular accident with right-sided weakness, able to walk with a walker.  3. Hypertension.  4. Diabetes.  5. Likely has seizure disorder, based on his Keppra medication.  6. Gastroesophageal reflux disease.  7. Likely benign prostatic hypertrophy, again based on his medications. He is not able to give me that history.   ALLERGIES: No known drug allergies.    CURRENT MEDICATIONS:  1. Actos 15 mg daily.  2. Advair Diskus 250/50 one puff twice a day. 3. Albuterol nebulizers 3 mL four times daily p.r.n. 4. Enteric coated aspirin 81 mg 1 tab p.o. daily.  5. Fish oil 1000 mg four caps at  bedtime.  6. Lasix 40 mg two tabs a day. 7. Keppra 500 mg 1 tab p.o. twice a day. 8. Ketoralac 10 mg 1 tab p.o. every 6 hours p.r.n. pain.  9. Klor-Con 20 mg 1 tab p.o. daily.  10. Omeprazole 20 mg daily.  11. Proscar 5 mg daily. 12. Spiriva 18 mcg one inhalation daily.  13. Theodore two caps daily.  14. Ventolin 1 to 2 puffs every four hours as needed.  15. Vitamin D 3000 international units daily.   SOCIAL HISTORY: The patient is a resident of [facility]. He continues to smoke 1 pack per day. No history of alcohol or drug use.   FAMILY HISTORY: The patient is not able to provide me with any family history.   REVIEW OF SYSTEMS: CONSTITUTIONAL: Denies any fevers. Complains of fatigue and weakness. No pain. No weight loss. No weight gain. EYES: No blurred or double vision. No pain. No redness. No inflammation. No glaucoma. No cataracts. ENT: No tinnitus. No ear pain. No hearing loss. No seasonal or year-round allergies. No epistaxis. No nasal discharge. No postnasal drip. No difficulty with swallowing. RESPIRATORY: Complains of cough and wheezing. No hemoptysis. Complains of dyspnea, history of chronic obstructive pulmonary disease, and history of previous presumed MRSA pneumonia/bronchitis. CARDIOVASCULAR: No chest pain. No orthopnea. No edema. No arrhythmia. Does complain of dyspnea on exertion. No palpitations. No syncope. GASTROINTESTINAL: No nausea, vomiting, or diarrhea. No abdominal pain. No hematemesis. No melena. No ulcers. GENITOURINARY: Denies any dysuria, hematuria, renal calculus, or frequency. GU/MALE: Denies any penile sores or discharge. No prostatitis. ENDOCRINE: Denies any polyuria, nocturia, or  thyroid problems. Denies any increased sweating, heat, or cold intolerance. HEME/LYMPH: Denies any anemia, easy bruisability or bleeding. SKIN: No acne. No rash. No changes in mole, hair, or skin. MUSCULOSKELETAL: No pain in the neck, back, or shoulder. NEUROLOGIC: Does  have history of CVA and history of likely seizures based on his medication; he is not able to tell me that. PSYCHIATRIC: Denies any anxiety, insomnia, or ADD.     PHYSICAL EXAMINATION:   VITAL SIGNS: Temperature 98.5, respirations 30, pulse 97, blood pressure 149/70, oxygen 100% on 100% nonrebreathing mask.   GENERAL: The patient is a 74 year old critically ill-appearing African American male who is in respiratory distress.   HEENT: Head atraumatic, normocephalic. Pupils are equally round and reactive to light and accommodation. Anicteric sclerae. No conjunctival pallor. Nasal exam shows no drainage or ulceration. Oropharynx mucosa is a little dry. No exudates.   NECK: No thyromegaly. No carotid bruits.   CARDIOVASCULAR: Regular rate and rhythm. No murmurs, rubs, clicks, or gallops. PMI is not displaced.   LUNGS: Bilateral wheezing throughout both lungs, as well as rhonchi. No rales or crackles.   ABDOMEN: Soft, nontender, and nondistended. No hepatosplenomegaly.   EXTREMITIES: Trace nonpitting edema. His right leg is cooler compared to the left, but he has DP and PT pulses.   SKIN: No rash.   LYMPHATICS: No lymph nodes palpable.   MUSCULOSKELETAL: There is no erythema or swelling.   NEURO: The patient has right upper extremity contracture, able to move all other extremities. Cranial nerves II through XII grossly intact.   VASCULAR: Good DP and PT pulses.   PSYCH: No anxious or depression.   LABS/STUDIES: In the emergency department, the patient's PA and lateral chest x-ray showed no acute abnormality.   Troponin less than 0.02. INR 1.0. WBC 10.2, hemoglobin 14.4, and platelet count 191. BMP: Glucose 146, BUN 18, creatinine 1.31, sodium 137, potassium 3.8, chloride 101, CO2 29, calcium 9.3. LFTs showed a total protein of 8.4. BNP 40.   ABG: pH 7.34, pCO2 48, pO2 73.    ASSESSMENT AND PLAN: The patient is a 74 year old African American male with history of chronic obstructive  pulmonary disease who presents with acute respiratory failure.  1. Acute respiratory failure, likely due to acute chronic obstructive pulmonary disease exacerbation, also has likely acute bronchitis. At this time, we will go ahead and treat the patient with IV Solu-Medrol every six hours as well as nebulizers every six hours with albuterol and Atrovent. Due to his acute bronchitis and chronic obstructive pulmonary disease exacerbation, I will place him on IV Levaquin. Try to obtain sputum cultures. He does have a history of methicillin-resistant Staphylococcus aureus in his sputum so we will await his cultures.  2. History of cerebrovascular accident. We will continue aspirin as previously.  3. Diabetes type 2, currently on Actos. Likely his blood sugars will worsen with steroid treatment. We will treat him with sliding scale insulin for the time being. We will need to adjust his sliding scale if his blood sugars worsen.  4. History of likely seizure. Continue Keppra as taking at home.  5. History of possible benign prostatic hypertrophy. Continue Proscar as taking at the facility.  6. Mild renal failure likely due to combination of chronic Lasix treatment as well as mild dehydration. We will give him low dose IV fluids and followup BMP in the a.m.  7. Miscellaneous. We will place him on Lovenox for deep vein thrombosis prophylaxis.   TIME SPENT: 35 minutes of critical  care time. ____________________________ Lacie Scotts Allena Katz, MD shp:slb D: 09/29/2011 10:09:59 ET     T: 09/29/2011 10:19:32 ET       JOB#: [id number] cc: Onelia Cadmus H. Allena Katz, MD, <Dictator> Meindert A. Lacie Scotts, MD Charise Carwin MD ELECTRONICALLY SIGNED 10/17/2011 7:49

## 2014-12-03 NOTE — Discharge Summary (Signed)
PATIENT NAME:  Colton Stevenson, Colton Stevenson MR#:  [id number] DATE OF BIRTH:  [birth year]  DATE OF ADMISSION:  09/29/2011 DATE OF DISCHARGE:  10/02/2011  PRIMARY CARE PHYSICIAN: Evelene Croon, MD  DISCHARGE DIAGNOSES:  1. Acute respiratory failure likely due to acute chronic obstructive pulmonary disease exacerbation, improving on steroids and antibiotic.  2. Mild acute renal failure, likely prerenal, improved with hydration.  3. Hypoxia likely due to chronic obstructive pulmonary disease exacerbation and will require home oxygen.   SECONDARY DIAGNOSES:  1. Chronic obstructive pulmonary disease with ongoing tobacco abuse.  2. History of cerebrovascular accident with right-sided weakness.  3. Hypertension.  4. Diabetes.  5. Seizure disorder. 6. Gastroesophageal reflux disease.  7. History of benign prostatic hypertrophy.   CONSULTANT: Physical Therapy.   PROCEDURES/RADIOLOGY: Chest x-ray on 09/29/2011 showed no acute cardiopulmonary disease.   HISTORY AND SHORT HOSPITAL COURSE: The patient is a 74 year old male with above-mentioned medical problems who was admitted for acute respiratory failure likely secondary to chronic obstructive pulmonary disease exacerbation. Please see Dr. Serita Grit Patel's dictated History and Physical on 09/29/2011 for further details. The patient was started on IV Levaquin, Solu-Medrol, and nebulizer breathing treatments. He was slowly improving on the above regimen and was evaluated by physical therapy while in the hospital and was doing well with them. They recommended returning back to the group home. The patient's breathing was back to baseline on 10/01/2011, although he was requiring oxygen. He did have minimal wheezing, but significant improvement since admission and was discharged back to the group home on 10/02/2011 in stable condition.   DISCHARGE PHYSICAL EXAMINATION:   VITAL SIGNS: On discharge his temperature was 98.4, heart rate 87 per minute, respirations 20 per  minute, blood pressure 132/73 mmHg, and he was saturating 88% on room air and 96% on 1 liter at rest.   HEART:  S1 and S2 normal. No murmur, rubs, or gallops.   LUNGS: Decreased breath sounds at the bases. No wheezes, rales, rhonchi, or crepitation.   ABDOMEN: Soft and benign.   NEUROLOGIC: Nonfocal examination. 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 All other physical examination remained at baseline.   DISCHARGE MEDICATIONS:  1. Vitamin D3 1000 international units once daily. 2. Actos 15 mg once daily.  3. Spiriva once daily.  4. Proscar 5 mg p.o. daily. 5. Omeprazole 20 mg p.o. daily. 6. Theo-24 200 mg two capsules p.o. daily.  7. Klor-Con 20 mEq p.o. daily.  8. Lasix 40 mg two tablets p.o. twice a day. 9. Aspirin 81 mg p.o. daily. 10. Advair 250/50 one puff twice a day. 11. Fish oil 1000 mg p.o. four capsules daily at bedtime. 12. Ketorolac 10 mg p.o. every six hours as needed. 13. Ventolin one to two puffs inhaled four times daily as needed.  14. Albuterol 3 mL inhaled four times daily as needed.  15. Keppra 500 mg p.o. three times daily. 16. Prednisone 60 mg p.o. daily, taper by 10 mg daily until finished.  17. Levaquin 500 mg p.o. daily for two days.  18. Robitussin 15 mL p.o. every six hours as needed for a total of five days.   DISCHARGE DIET: Low sodium, 1800 ADA.   DISCHARGE ACTIVITY: As tolerated.   DISCHARGE INSTRUCTIONS AND FOLLOW-UP:  1. The patient was instructed to use 2 liters of oxygen on ambulation and 1 liter at rest.  2. He was instructed to follow-up with his primary care physician, Dr. Lacie Scotts, in 1 to 2 weeks, and was  agreeable with the discharge planning.   TOTAL TIME DISCHARGING THIS PATIENT: 55 minutes.  ____________________________ Ellamae SiaVipul S. Sherryll BurgerShah, MD vss:slb D: 10/03/2011 23:53:00 ET T: 10/04/2011 13:56:53 ET JOB#: [id number]  cc: Meindert A. Lacie ScottsNiemeyer, MD Alec Jaros S. Sherryll BurgerShah, MD, <Dictator> Ellamae SiaVIPUL S [facility]  MD ELECTRONICALLY SIGNED 10/04/2011 19:17

## 2014-12-03 NOTE — H&P (Signed)
PATIENT NAME:  Colton Stevenson, Colton Stevenson MR#:  [id number] DATE OF BIRTH:  [birth year]  DATE OF ADMISSION:  10/03/2011  PRIMARY CARE PHYSICIAN: Evelene CroonMeindert Niemeyer, MD   ER REFERRING PHYSICIAN: Rockne MenghiniAnne-Caroline Norman, MD   REASON FOR ADMISSION: Confusion as well as acute respiratory failure.   HISTORY OF PRESENT ILLNESS: The patient is a 74 year old white male who was actually hospitalized here on 02/18 and then was discharged subsequently on 02/20. At that time, the patient was seen in the ED with progressive shortness of breath. He had to be placed on 100% nonrebreather. At that time he was awake and conversant. The patient was admitted with acute respiratory failure due to acute chronic obstructive pulmonary disease exacerbation, was treated with nebulizers, steroids, and antibiotics with improvement in his symptoms, and was discharged back to the group home with oxygen. He was sent back with decreased oxygen saturations as well as decreasing responsiveness. The patient was noted to have elevated pCO2 of 55 on ABG. He had a CT scan of the head that was negative. His CBC was normal. They were unable to get a BMP blood work. The patient currently is on BiPAP, opens his eyes, but is very lethargic and is unable to give me any history.   PAST MEDICAL/SURGICAL HISTORY: Significant for: 1. Chronic obstructive pulmonary disease with ongoing tobacco abuse now, was discharged home on home oxygen.  2. History of cerebrovascular accident with right-sided weakness. He was able to walk with a walker.  3. Hypertension.  4. Diabetes.  5. Seizure disorder.  6. Gastroesophageal reflux disease.  7. History of benign prostatic hypertrophy.   ALLERGIES: None.   CURRENT MEDICATIONS:  1. Vitamin D3 1000 international units daily.  2. Actos 15 mg daily.  3. Spiriva 18 mcg daily.  4. Proscar 5 mg daily.  5. Omeprazole 20 mg daily.  6. Theophylline 200 mg, 2 caps daily.  7. Klor-Con 20 mEq daily.  8. Lasix 40 mg, 2 tabs p.o.  b.i.d.  9. Aspirin 81 mg, 1 tab p.o. daily.  10. Advair 250/50, 1 puff b.i.d.   11. Fish oil 1000 mcg, 4 caps daily.  12. Ketorolac 10 mg, 1 tab p.o. every 6 hours p.r.n.  13. Ventolin 1 to 2 puffs every 4 hours p.r.n.  14. Albuterol-Atrovent nebulizers q.i.d. p.r.n.   15. Keppra 500, 1 tab p.o. b.i.d.  16. Prednisone 60 mg p.o. daily, taper by 10 mg until finished. 17. Levaquin 500 mg p.o. daily.  18. Robitussin 15 mL p.o. every 6 hours p.r.n.   SOCIAL HISTORY: The patient is a resident of [facility] and continues to smoke. No history of alcohol or drug use.   FAMILY HISTORY: The patient is unable to provide the history from his recent hospitalization. He was not able to provide this history as well.   REVIEW OF SYSTEMS: Unable to provide me with review of systems due to his mental status changes.   PHYSICAL EXAMINATION:  VITAL SIGNS: Temperature 96.7, pulse 64, respirations 18, blood pressure 102/65, O2 92% on BiPAP.   GENERAL: The patient is an [place] male, obese, critically ill-appearing. He opens his eyes but does not answer any questions.   HEENT: Head atraumatic, normocephalic. Pupils are equally round, reactive to light and accommodation. Extraocular movements are intact. Ears: Exam shows no erythema or swelling  NECK: There is no thyromegaly. No carotid bruits. Nasal exam shows no drainage or ulceration. Marland Kitchen.  CARDIOVASCULAR: Regular rate and rhythm. No murmurs, rubs, clicks, or gallops.  PMI is not displaced.   LUNGS: He has got bilateral rhonchi and wheezing. No rales or crackles. He has accessory muscle usage.   ABDOMEN: Soft, nontender, nondistended. No hepatosplenomegaly.   EXTREMITIES: He has got trace edema.   SKIN: No rash.   LYMPHATICS: No lymph nodes palpable.   MUSCULOSKELETAL: There is no erythema or swelling.   NEUROLOGICAL: The patient has right upper extremity contracture. Unable to do a thorough neurological exam due to him  being unable to follow commands. Otherwise, cranial nerves II through XII are grossly intact.   VASCULAR: Good DP, PT pulses.   PSYCHIATRIC: Not anxious or depressed. Very lethargic.   LABORATORY, DIAGNOSTIC AND RADIOLOGICAL DATA:  In the Emergency Department , his CBC showed a WBC of 8.0, hemoglobin 13.9, platelet count 191.  ABG: pH 7.40, pCO2 of 55, pO2 of 71, FiO2 on 28%.  Lactic acid was slightly elevated at 1.6.  Chest x-ray shows no acute cardiopulmonary processes.  CT scan of the head shows chronic encephalopathic changes involving the left temporoparietal region. There is no evidence of a skull fracture. Chronic changes without evidence of acute abnormality.   ASSESSMENT AND PLAN: The patient is a 74 year old African American male with recent hospitalization for acute respiratory failure due to chronic obstructive pulmonary disease exacerbation, [facility], noted to have an elevated pCO2 of 55.   1. Acute encephalopathy: Likely related to recurrent respiratory failure, but also could have other metabolic cause for his encephalopathy. At this time, he is getting a central line, so we will have to see what his BMP shows to make sure there are no significantly altered electrolytes causing his current symptoms. He had a CT scan of the head which was negative. At this time, we will continue his BiPAP as he is currently on. We will follow his mental status, if continues to show no improvement then we will need an MRI of his brain.  2. Acute on chronic respiratory failure: At this time we will place the patient on nebulizers, steroids. In light of him being on recent Levaquin, I will change him to ceftriaxone and azithromycin, and BiPAP. Due to his mental status changes, respiratory failure, he will need to be monitored in the Critical Care Unit.  3. Diabetes, type 2:  Accu-Cheks every 6 hours. We will place him on sliding scale diabetic  treatment.  4. History of seizure disorder: I will give him IV Keppra b.i.d.  5. Hypertension: Blood pressure is currently low borderline normal. We will hold his antihypertensive medications.  6. Miscellaneous: We will place him on Lovenox for deep vein thrombosis prophylaxis.   CODE STATUS: FULL CODE. This is his second hospitalization within the past few months. The patient on his previous hospitalization had reported that he has brothers, but they are also in a nursing home. At this point, I have no contact person to discuss regarding code status. I will have the Palliative Care team assist with code status and further treatment if he does not improve.   TIME SPENT: 40 minutes critical care time spent.    ____________________________ Lacie Scotts. Allena Katz, MD shp:cbb D: 10/03/2011 18:02:56 ET T: 10/03/2011 20:48:17 ET JOB#: [id number]  cc: Neithan Day H. Allena Katz, MD, <Dictator> Meindert A. Lacie Scotts, MD Charise Carwin MD ELECTRONICALLY SIGNED 10/17/2011 7:53

## 2014-12-03 NOTE — Discharge Summary (Signed)
PATIENT NAME:  Colton Stevenson, Colton Stevenson MR#:  [id number] DATE OF BIRTH:  1941/06/28  DATE OF ADMISSION:  10/03/2011 DATE OF DISCHARGE: 10/13/2011  Please refer to discharge summary to have been dictated by Dr. Cherylann RatelLateef through the hospital course up to 10/10/2011.   FINAL DIAGNOSES:  1. Metabolic encephalopathy.  2. Acute on chronic respiratory failure.  3. Pneumonia.  4. Urinary tract infection.  5. Leukocytosis.  6. Agitation.  7. Rhabdomyolysis.  8. Hypertension.  9. Diabetes. 10. CVA with right-sided weakness.  11. Seizure disorder.   MEDICATIONS ON DISCHARGE:  1. Albuterol nebulizer 3 mL 4 times a day as needed for shortness of breath.  2. Vitamin D 1000 international units daily.  3. Fish oil 1000 mg 4 tablets daily.  4. Spiriva 18 mcg 1 inhalation daily.  5. Keppra 500 mg orally twice a day.  6. Proscar 5 mg daily.  7. Advair Diskus 250/50, 1 inhalation twice a day.  8. Omeprazole 20 mg daily.  9. Theo-24 200 mg, 1 tablet every 24 hours.  10. Ventolin HFA 1 to 2 puffs  4 times a day.  11. Aspirin 81 mg daily.  12. Lasix 20 mg daily.  13. Zyvox 600 mg p.o. twice a day for seven more days then stop.  14. NovoLog sliding scale before meals and at bedtime, 2 units for fingersticks 151 to 200, 4 units for fingerstick 201 to 250, 6 units for fingerstick 251 to 300, 8 units for fingerstick greater than 301.  15. Oxygen: 2 liters nasal cannula.   DIET: Low sodium, 1800 ADA, pureed diet with thin liquids. Medications in puree, crush if able. Tray set up for all meals. Must sit fully upright with all oral intake. Encourage the patient to eat small bites slowly and clear mouth between bites. Moisten food well. Monitor during meals.   ACTIVITY: As tolerated.  REFERRAL: Physical therapy.   FOLLOWUP: Follow up in 1 to 2 days with the doctor at the nursing home.  REASON FOR ADMISSION: The patient was admitted 10/03/2011 with confusion and acute respiratory failure. The patient was recently in  the hospital and discharged. In the Emergency Room he had to be put on 100% nonrebreather. He was admitted with acute encephalopathy, acute on chronic respiratory failure. He was put on ceftriaxone and Zithromax and BiPAP initially.  LABORATORY, DIAGNOSTIC, AND RADIOLOGICAL DATA DURING THE HOSPITAL COURSE:  Blood cultures were negative.  Initial chest x-ray showed no acute changes. EKG showed sinus rhythm with short PR. ABG showed pH of 7.4, pCO2 55, pO2 of 71 on 28% oxygen. Oxygen saturation 95.2. CT scan of the head showed chronic changes without evidence of acute abnormalities. Keppra level 6.4. White blood cell count 8, hemoglobin and hematocrit of 13.9 and 41.9, platelet count 191, glucose 137, BUN 28, creatinine 1.1, sodium 142, potassium 4.3, chloride 101, CO2 33, calcium 8.8. AST slightly elevated at 46. TSH 0.098. Theophylline 36. Chest x-ray mild left basilar opacity, may represent atelectasis. Infection not excluded. Urine culture grew out Escherichia coli sensitive to ceftriaxone. Urinalysis 1+ leukocyte esterase, ammonia level less than 25. Troponin negative. Free thyroxine 1.05. Hemoglobin A1c 6.9. Echocardiogram: Ejection fraction greater than 55%, normal left ventricular wall thickness. 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 LDL 110, HDL 54, triglycerides 77, white count went up to 19 on the 02/24, 28.5 on 02/28.  At the time of discharge, white blood cell count 21.6. Last hemoglobin 14.1. Last platelet count 156. Last glucose  79, BUN 14, creatinine 0.96, sodium 140, potassium 3.8, chloride 104, CO2 32, calcium 8.4.    HOSPITAL COURSE PER PROBLEM LIST:  1. Metabolic encephalopathy: This improved with treatment of pneumonia and urinary tract infection. The patient seems to be back to baseline mental status. The patient does have a guardian.  2. Acute on chronic respiratory failure: The patient is currently on 2 liters oxygen, required BiPAP on admission. I  do think the patient has a poor cough reflex which will cause him to be susceptible to infections in the lung. The patient is breathing comfortably on 2 liters, does have some slight expiratory wheeze.  3. Pneumonia: This was treated with Rocephin and Zithromax during the hospital course. With the increased white count Zyvox was added and that will be prescribed to completion. The chest x-rays were read as possibility of pneumonia, did not really clarify if it was, but we did treat for it.  4. Urinary tract infection: Escherichia coli. The patient was given a full course of Rocephin for 10 days.  5. Leukocytosis: The patient's white count bumped up during the hospital course, unclear etiology of why, could be steroid related because the patient was on high-dose steroids and that was tapered off. The last dose of the steroid will be on 03/04 prior to discharge. I do recommend checking a CBC in one week after completion of the Zyvox.  6. Agitation: Resolved.   7. Rhabdomyolysis: That had resolved. IV fluids were given. The patient will be put back on a lower dose of Lasix than previous.  8. Hypertension: Blood pressure during the hospital stay was actually on the lower side and variable. Blood pressure does vary with mood. We will restart the low dose Lasix.  9. Diabetes: His sugars have actually been very good. We have only been doing low dose sliding scale. I held his Actos that he was previously on.  10. CVA with right-sided weakness: Physical therapy evaluation. The patient is on a dysphagia diet.  11. Seizure disorder: The patient is on Keppra.   TIME SPENT ON DISCHARGE: 40 minutes.    ____________________________ Herschell Dimes. Renae Gloss, MD rjw:bjt D:  10/13/2011 09:22:57 ET         T: 10/13/2011 09:40:46 ET        JOB#: [id number]  cc:   Meindert A. Lacie Scotts, MD Salley Scarlet MD ELECTRONICALLY SIGNED 10/15/2011 13:12

## 2015-06-28 IMAGING — CR DG CHEST 1V PORT
1 series · 2 of 2 positions shown · non-contrast
Comparison: DG CHEST 1V PORT dated 07/29/2013;

CLINICAL DATA: Shortness of breath.

EXAM:
PORTABLE CHEST - 1 VIEW

[Series 1: ap · 0.17mm/px · 2 of 2 slices shown]
[im 1/2]
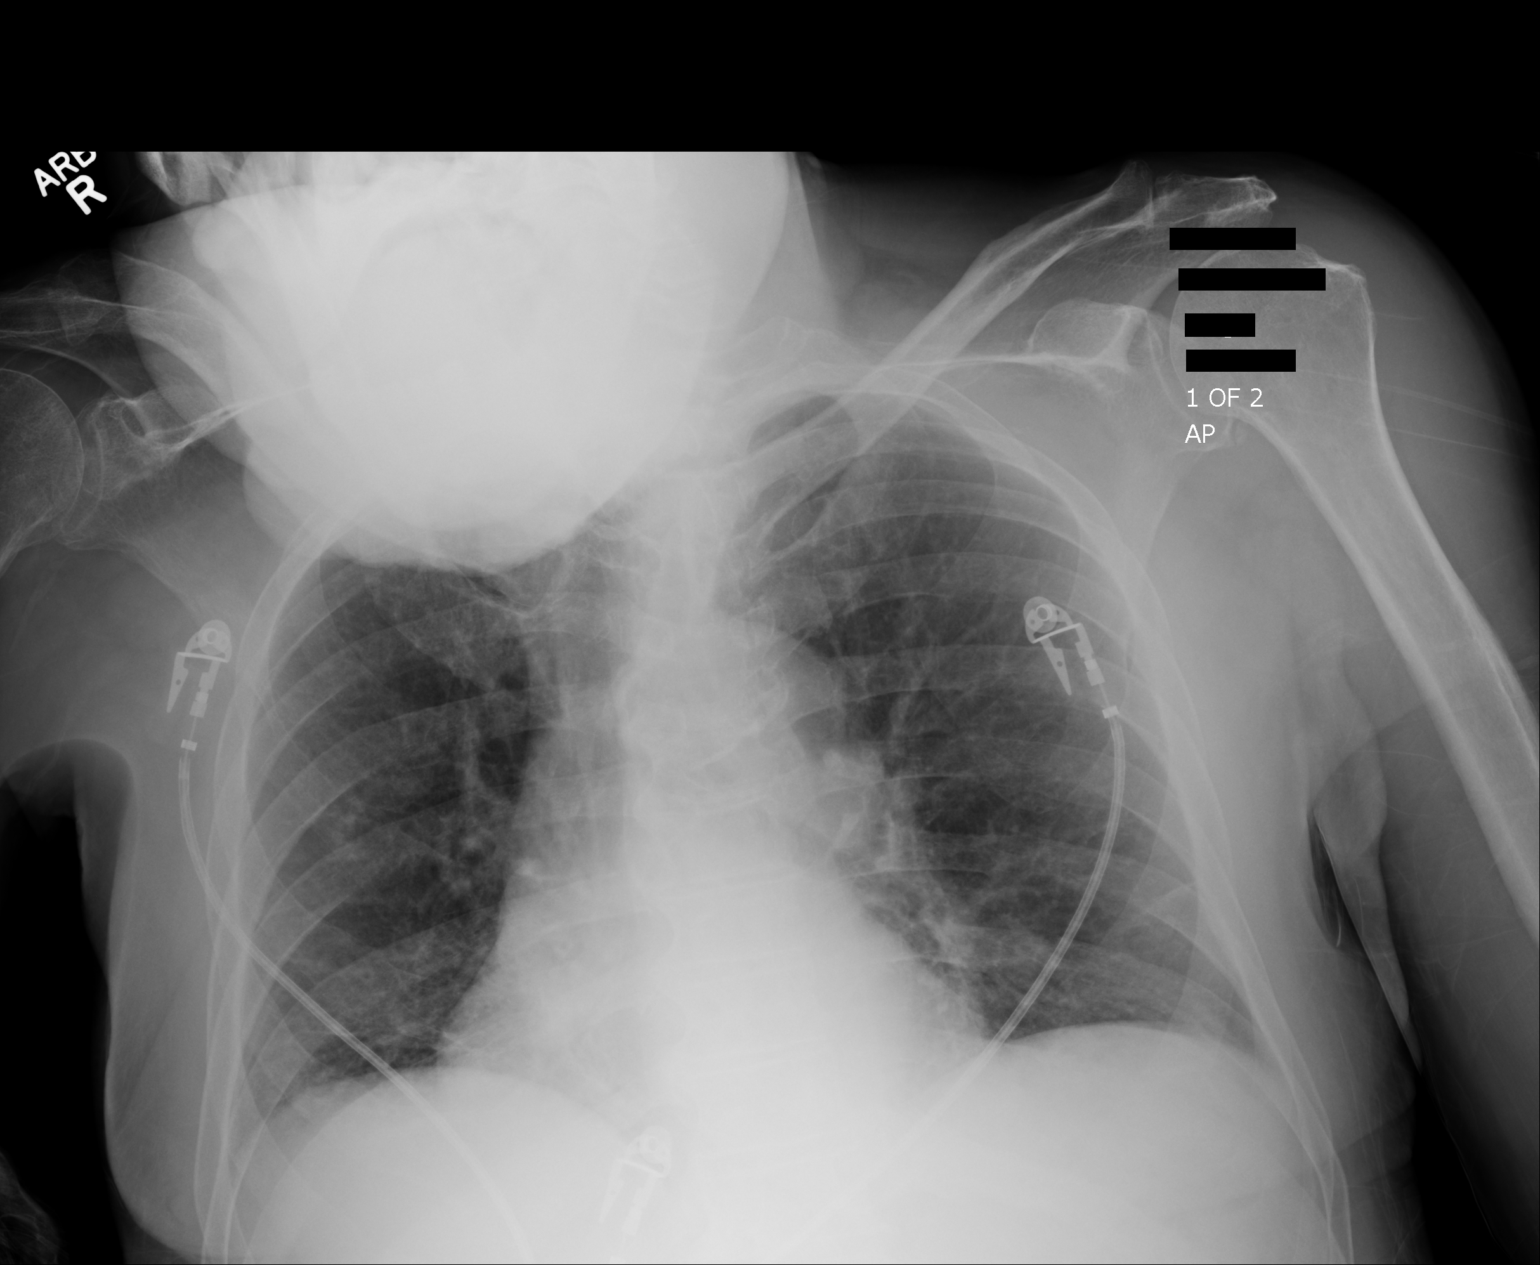
[im 2/2]
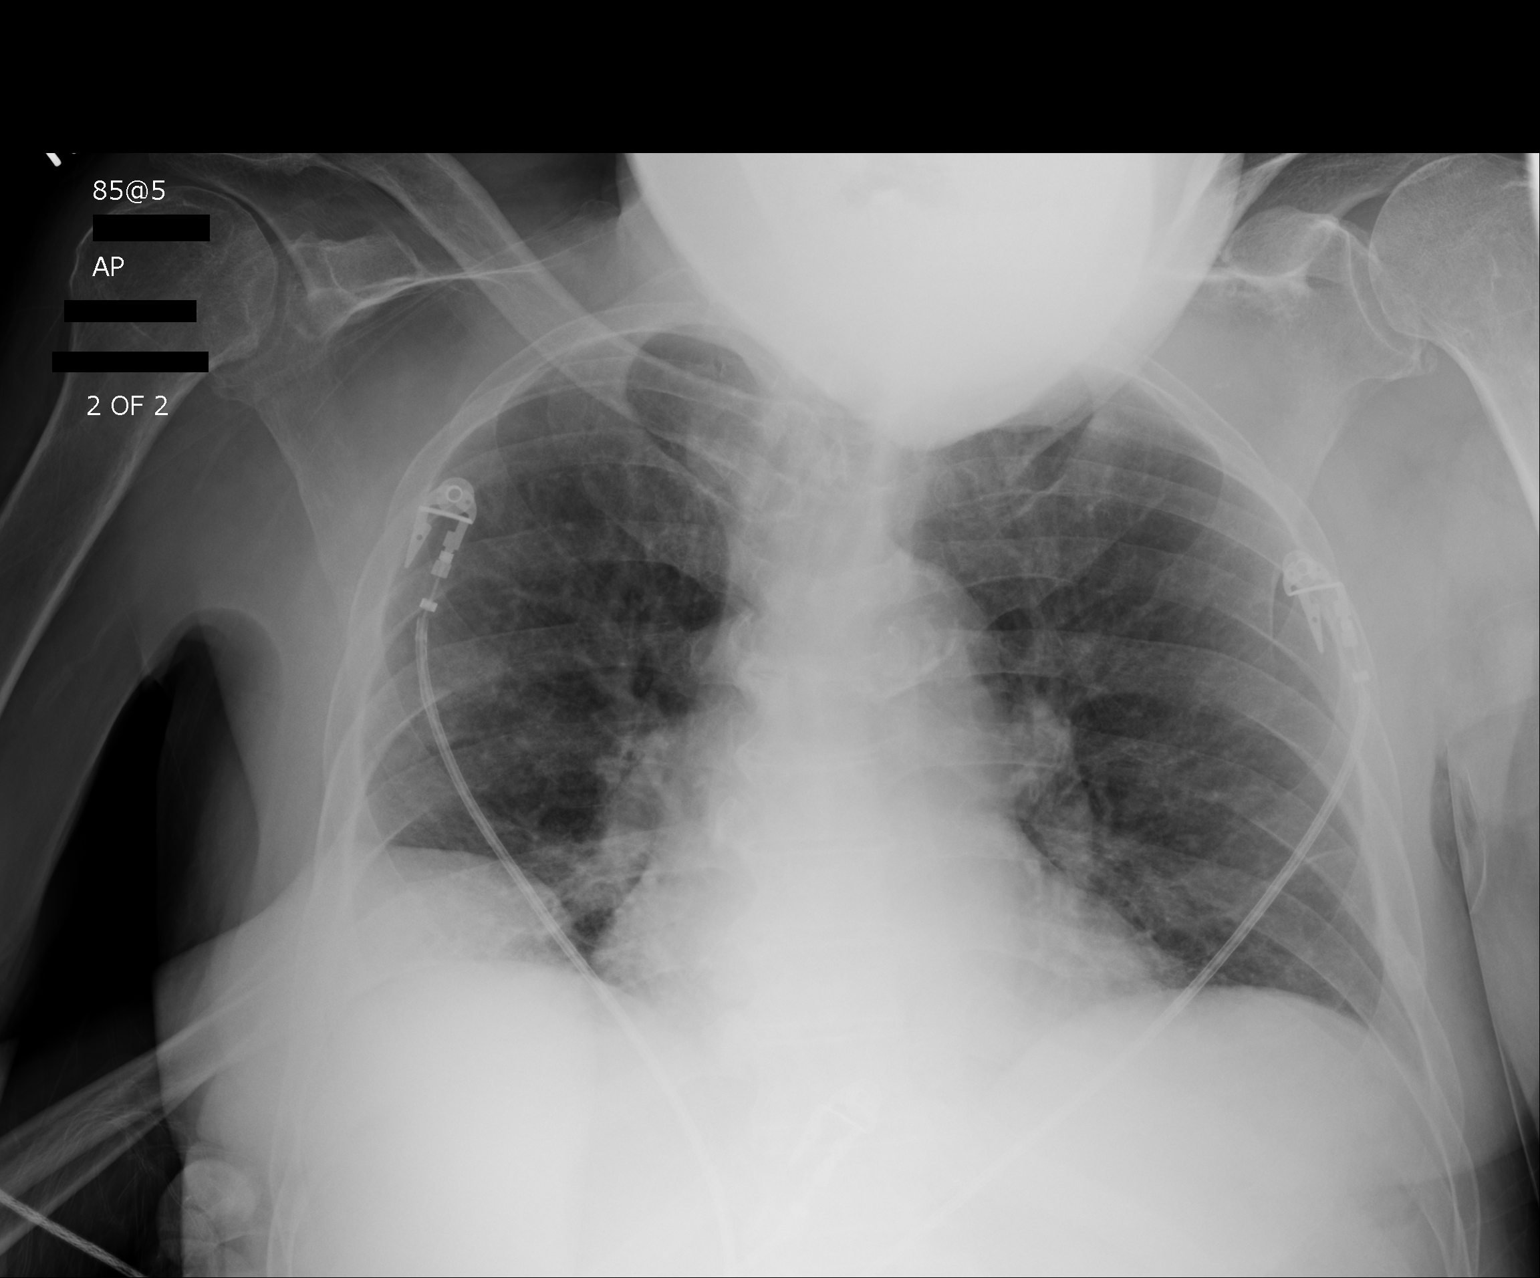

[2 of 2 positions shown; findings below may reference images not displayed]

DG CHEST 2V dated
06/17/2012; DG CHEST 1V PORT dated 10/04/2011; DG CHEST 1V PORT dated
10/03/2011; DG CHEST 1V PORT dated 06/18/2010
FINDINGS: Patient rotated to the right. Patient's chin overlies the right
apex. Suboptimal inspiration accounts for crowded bronchovascular
markings diffusely and atelectasis in the bases, and accentuates the
cardiac silhouette. Taking this into account, cardiac silhouette
mildly enlarged but stable. Lungs otherwise clear. No localized
airspace consolidation. No pleural effusions. No pneumothorax.
Normal pulmonary vascularity.
IMPRESSION: Suboptimal inspiration accounts for mild bibasilar atelectasis. No
acute cardiopulmonary disease otherwise.
